# Patient Record
Sex: Male | Born: 1945 | Smoking: Never smoker
Health system: Southern US, Community
[De-identification: ages and names within clinical notes are randomized; demographics above are authoritative.]

## PROBLEM LIST (undated history)

## (undated) DIAGNOSIS — I10 Essential (primary) hypertension: Secondary | ICD-10-CM

## (undated) DIAGNOSIS — E785 Hyperlipidemia, unspecified: Secondary | ICD-10-CM

## (undated) DIAGNOSIS — I499 Cardiac arrhythmia, unspecified: Secondary | ICD-10-CM

## (undated) HISTORY — DX: Cardiac arrhythmia, unspecified: I49.9

## (undated) HISTORY — DX: Essential (primary) hypertension: I10

## (undated) HISTORY — DX: Hyperlipidemia, unspecified: E78.5

---

## 2014-11-23 ENCOUNTER — Other Ambulatory Visit: Payer: Self-pay

## 2014-11-23 DIAGNOSIS — M79676 Pain in unspecified toe(s): Secondary | ICD-10-CM

## 2014-12-14 ENCOUNTER — Encounter: Payer: Self-pay | Admitting: Vascular Surgery

## 2014-12-14 ENCOUNTER — Encounter (HOSPITAL_COMMUNITY): Payer: Self-pay

## 2014-12-20 ENCOUNTER — Encounter: Payer: Self-pay | Admitting: Vascular Surgery

## 2014-12-21 ENCOUNTER — Ambulatory Visit (INDEPENDENT_AMBULATORY_CARE_PROVIDER_SITE_OTHER): Payer: PPO | Admitting: Vascular Surgery

## 2014-12-21 ENCOUNTER — Other Ambulatory Visit: Payer: Self-pay | Admitting: *Deleted

## 2014-12-21 ENCOUNTER — Other Ambulatory Visit: Payer: Self-pay | Admitting: Vascular Surgery

## 2014-12-21 ENCOUNTER — Encounter: Payer: Self-pay | Admitting: Vascular Surgery

## 2014-12-21 ENCOUNTER — Ambulatory Visit (HOSPITAL_COMMUNITY)
Admission: RE | Admit: 2014-12-21 | Discharge: 2014-12-21 | Disposition: A | Discharge status: Home / Self Care | Payer: PPO | Source: Ambulatory Visit | Attending: Vascular Surgery | Admitting: Vascular Surgery

## 2014-12-21 VITALS — BP 175/111 | HR 56 | Resp 18 | Ht 76.0 in | Wt 221.4 lb

## 2014-12-21 DIAGNOSIS — M79676 Pain in unspecified toe(s): Secondary | ICD-10-CM

## 2014-12-21 DIAGNOSIS — I739 Peripheral vascular disease, unspecified: Secondary | ICD-10-CM

## 2014-12-21 LAB — BUN: BUN: 19 mg/dL (ref 6–23)

## 2014-12-21 LAB — CREATININE, SERUM: Creat: 1.1 mg/dL (ref 0.50–1.35)

## 2014-12-21 NOTE — Progress Notes (Signed)
Filed Vitals:   12/21/14 1238 12/21/14 1253  BP: 176/100 175/111  Pulse: 56   Resp: 18   Height: 6\' 4"  (1.93 m)   Weight: 221 lb 6.4 oz (100.426 kg)   Body mass index is 26.96 kg/(m^2).

## 2014-12-21 NOTE — Progress Notes (Signed)
[facility] [place] HISTORY AND PHYSICAL   History of Present Illness:  Patient is a 69 y.o. year old male who presents for evaluation of blistering and dark spots bilateral toes. The patient had an episode in January 2016. He spontaneously developed several dark spots on all of his toes bilaterally. The dark spots then became blisterlike. He was placed on antibiotics and some type of ointment. These spontaneously healed over a few weeks. He states they were painful but the pain has now resolved. He denies claudication. He denies rest pain. He denies prior similar episodes. He denies history of Raynaud's. He denies significant cold exposure during the time the blisters formed.  He is a former smoker but quit 50 years ago. He has no family history of Raynaud's or significant early peripheral arterial disease. He was tested for diabetes and this was negative.  Chronic medical problems include an irregular heartbeat and hypertension as well as hyperlipidemia. These are all currently controlled. He is followed at the [facility] hospital in the room for these. He does take one aspirin daily and has been on this for years.  Past Medical History  Diagnosis Date  . Irregular heartbeat   . Hypertension   . Hyperlipidemia    History reviewed. No pertinent past surgical history.  Social History History  Substance Use Topics  . Smoking status: Not on file  . Smokeless tobacco: Not on file  . Alcohol Use: Not on file    Family History  Denies family history of peripheral arterial disease or abdominal aortic aneurysm Allergies  No Known Allergies    ROS:   General:  No weight loss, Fever, chills  HEENT: No recent headaches, no nasal bleeding, no visual changes, no sore throat  Neurologic: No dizziness, blackouts, seizures. No recent symptoms of stroke or mini- stroke. No recent episodes of slurred speech, or temporary blindness.  Cardiac: No recent episodes of chest  pain/pressure, no shortness of breath at rest.  + shortness of breath with exertion.    Vascular: No history of rest pain in feet.  No history of claudication.  No history of non-healing ulcer, No history of DVT   Pulmonary: No home oxygen, no productive cough, no hemoptysis,  No asthma or wheezing  Musculoskeletal:  [ ]  Arthritis, [ ]  Low back pain,  [ ]  Joint pain  Hematologic:No history of hypercoagulable state.  No history of easy bleeding.  No history of anemia  Gastrointestinal: No hematochezia or melena,  No gastroesophageal reflux, no trouble swallowing  Urinary: [ ]  chronic Kidney disease, [ ]  on HD - [ ]  MWF or [ ]  TTHS, [ ]  Burning with urination, [ ]  Frequent urination, [ ]  Difficulty urinating;   Skin: No rashes  Psychological: No history of anxiety,  No history of depression   Physical Examination  Filed Vitals:   12/21/14 1238  BP: 176/100  Pulse: 56  Resp: 18  Height: 6\' 4"  (1.93 m)  Weight: 221 lb 6.4 oz (100.426 kg)    Body mass index is 26.96 kg/(m^2).  General:  Alert and oriented, no acute distress HEENT: Normal Neck: No bruit or JVD Pulmonary: Clear to auscultation bilaterally Cardiac: Regular Rate and Rhythm without murmur Abdomen: Soft, non-tender, non-distended, no mass, no scars Skin: No rash, dry scaly areas of skin tips of toe 2 right foot and all digits except toe 1 left foot Extremity Pulses:  2+ radial, brachial,1+ femoral, 2+ dorsalis pedis, absent posterior tibial pulses bilaterally Musculoskeletal: No  deformity or edema  Neurologic: Upper and lower extremity motor 5/5 and symmetric  DATA:  The patient had bilateral ABIs performed on March 30.  These were both greater than 1 bilaterally with triphasic waveforms   ASSESSMENT:  History consistent with a central atheroembolic event. Otherwise this may have been some sort of cutaneous infection or variant of Raynaud's less likely   PLAN:  CT Angio abdomen pelvis with bilateral lower  extremity runoff to further evaluate for atheroembolic source.  The patient will return for follow-up after his CT scan.  Ruta Hinds, MD [facility] [place] Office: [phone] Pager: [phone]

## 2015-01-04 ENCOUNTER — Other Ambulatory Visit: Payer: Self-pay

## 2015-01-04 DIAGNOSIS — I714 Abdominal aortic aneurysm, without rupture, unspecified: Secondary | ICD-10-CM

## 2015-01-04 DIAGNOSIS — Z01812 Encounter for preprocedural laboratory examination: Secondary | ICD-10-CM

## 2015-01-05 ENCOUNTER — Ambulatory Visit
Admission: RE | Admit: 2015-01-05 | Discharge: 2015-01-05 | Disposition: A | Discharge status: Home / Self Care | Payer: PPO | Source: Ambulatory Visit | Attending: Vascular Surgery | Admitting: Vascular Surgery

## 2015-01-05 DIAGNOSIS — I739 Peripheral vascular disease, unspecified: Secondary | ICD-10-CM

## 2015-01-05 MED ORDER — IOPAMIDOL (ISOVUE-370) INJECTION 76%
150.0000 mL | Freq: Once | INTRAVENOUS | Status: AC | PRN
  Administered 2015-01-05: 150 mL via INTRAVENOUS

## 2015-01-11 ENCOUNTER — Encounter: Payer: Self-pay | Admitting: Vascular Surgery

## 2015-01-12 ENCOUNTER — Ambulatory Visit (INDEPENDENT_AMBULATORY_CARE_PROVIDER_SITE_OTHER): Payer: PPO | Admitting: Vascular Surgery

## 2015-01-12 ENCOUNTER — Encounter: Payer: Self-pay | Admitting: Vascular Surgery

## 2015-01-12 VITALS — BP 118/78 | HR 86 | Ht 76.0 in | Wt 220.0 lb

## 2015-01-12 DIAGNOSIS — I739 Peripheral vascular disease, unspecified: Secondary | ICD-10-CM

## 2015-01-12 NOTE — Progress Notes (Signed)
[facility] [place] HISTORY AND PHYSICAL    History of Present Illness:  Patient is a 69 y.o. year old male who presents for follow-up evaluation of blistering and dark spots bilateral toes. The patient had an episode in January 2016. He spontaneously developed several dark spots on all of his toes bilaterally. The dark spots then became blisterlike. He was placed on antibiotics and some type of ointment. These spontaneously healed over a few weeks. He states they were painful but the pain has now resolved. He denies claudication. He denies rest pain. He denies prior similar episodes. He denies history of Raynaud's. He denies significant cold exposure during the time the blisters formed.  He is a former smoker but quit 50 years ago. He has no family history of Raynaud's or significant early peripheral arterial disease. He was tested for diabetes and this was negative.  Chronic medical problems include an irregular heartbeat and hypertension as well as hyperlipidemia. These are all currently controlled. He is followed at the [facility] hospital in the room for these. He does take one aspirin daily and has been on this for years.  He has had no further events since he was last seen several weeks ago.    Past Medical History   Diagnosis  Date   .  Irregular heartbeat     .  Hypertension     .  Hyperlipidemia      History reviewed. No pertinent past surgical history.  Social History History   Substance Use Topics   .  Smoking status:  Not on file   .  Smokeless tobacco:  Not on file   .  Alcohol Use:  Not on file     Family History  Denies family history of peripheral arterial disease or abdominal aortic aneurysm Allergies  No Known Allergies    ROS:    General:  No weight loss, Fever, chills  HEENT: No recent headaches, no nasal bleeding, no visual changes, no sore throat  Neurologic: No dizziness, blackouts, seizures. No recent symptoms of stroke or mini- stroke. No  recent episodes of slurred speech, or temporary blindness.  Cardiac: No recent episodes of chest pain/pressure, no shortness of breath at rest.  + shortness of breath with exertion.     Vascular: No history of rest pain in feet.  No history of claudication.  No history of non-healing ulcer, No history of DVT    Pulmonary: No home oxygen, no productive cough, no hemoptysis,  No asthma or wheezing  Musculoskeletal:  [ ]  Arthritis, [ ]  Low back pain,  [ ]  Joint pain  Hematologic:No history of hypercoagulable state.  No history of easy bleeding.  No history of anemia  Gastrointestinal: No hematochezia or melena,  No gastroesophageal reflux, no trouble swallowing  Urinary: [ ]  chronic Kidney disease, [ ]  on HD - [ ]  MWF or [ ]  TTHS, [ ]  Burning with urination, [ ]  Frequent urination, [ ]  Difficulty urinating;    Skin: No rashes  Psychological: No history of anxiety,  No history of depression   Physical Examination    Filed Vitals:   01/12/15 1519  BP: 118/78  Pulse: 86  Height: 6\' 4"  (1.93 m)  Weight: 220 lb (99.791 kg)  SpO2: 85%    General:  Alert and oriented, no acute distress HEENT: Normal Neck: No bruit or JVD Pulmonary: Clear to auscultation bilaterally Cardiac: Regular Rate and Rhythm without murmur Abdomen: Soft, non-tender, non-distended, no mass, no scars  Skin: No rash, dry scaly areas of skin tips of toe 2 right foot and all digits except toe 1 left foot Extremity Pulses:  2+ radial, brachial,1+ femoral, 2+ dorsalis pedis, absent posterior tibial pulses bilaterally Musculoskeletal: No deformity or edema     Neurologic: Upper and lower extremity motor 5/5 and symmetric  DATA:  The patient had bilateral ABIs performed on March 30.  These were both greater than 1 bilaterally with triphasic waveforms.  I reviewed his CT angiogram the abdomen and pelvis today. There is some scattered calcified plaque with no flow limiting lesions from the aorta all the way to the toes  bilaterally. No obvious source of embolic material. The images were reviewed with the patient in the room today. Some calcific plaque scattered appropriate for age with no irregularity or thrombus visualized   ASSESSMENT:  History consistent with a central atheroembolic event. Otherwise this may have been some sort of cutaneous infection or variant of Raynaud's  all lesions have healed at this point.   PLAN:  CT Angio of the chest if he has recurrent symptoms. Otherwise daily aspirin no further follow-up recommended  Ruta Hinds, MD [facility] [place] Office: [phone] Pager: [phone]

## 2016-01-23 DIAGNOSIS — H2513 Age-related nuclear cataract, bilateral: Secondary | ICD-10-CM | POA: Diagnosis not present

## 2016-02-28 DIAGNOSIS — L209 Atopic dermatitis, unspecified: Secondary | ICD-10-CM | POA: Diagnosis not present

## 2016-02-28 DIAGNOSIS — L820 Inflamed seborrheic keratosis: Secondary | ICD-10-CM | POA: Diagnosis not present

## 2016-02-28 DIAGNOSIS — C44319 Basal cell carcinoma of skin of other parts of face: Secondary | ICD-10-CM | POA: Diagnosis not present

## 2016-05-23 IMAGING — CT CT ANGIO AOBIFEM WO/W CM
1 of 10 series · 18 of 38 positions shown · IV contrast ([ID] ISOVUE 370)
Comparison: None.

CLINICAL DATA: Episode 6 months ago, where all toes were red x 2
wks, improved after 2 wks of antibiotics, occasionally large toes
get cold, ex-smoker x 50 yrs, w/o trauma,surgery or hx of ca

EXAM:
CT ANGIOGRAPHY AOBIFEM WITHOUT AND WITH CONTRAST
TECHNIQUE: Volumetric CT image acquisition was acquired from the lung bases
through the lower extremities during and following the rapid
infusion of 150 mL of Isovue 370 intravenous contrast.
Computer-generated MIP and MPR reconstructed images were also
acquired in the coronal and sagittal planes.
CONTRAST:  150 mL of Isovue 370 intravenous contrast

[Series 5: runoff · axial · 0.74mm/px · z∈[-1482,-60]mm · 18 of 565 slices shown]
[im 22/565  lung]
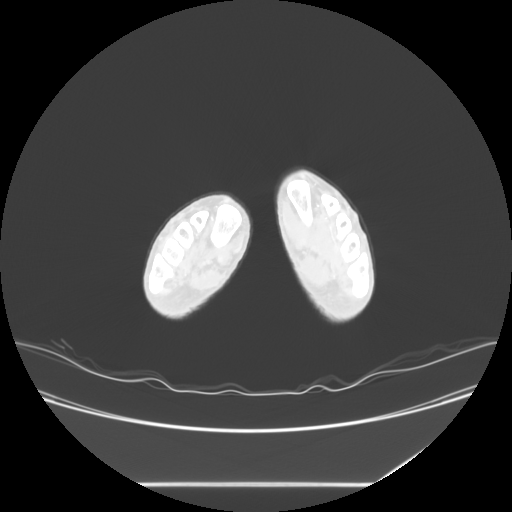
[im 66/565  mediastinal]
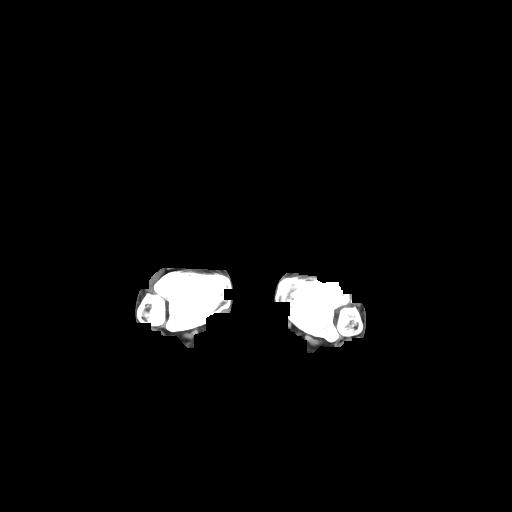
[im 87/565  lung]
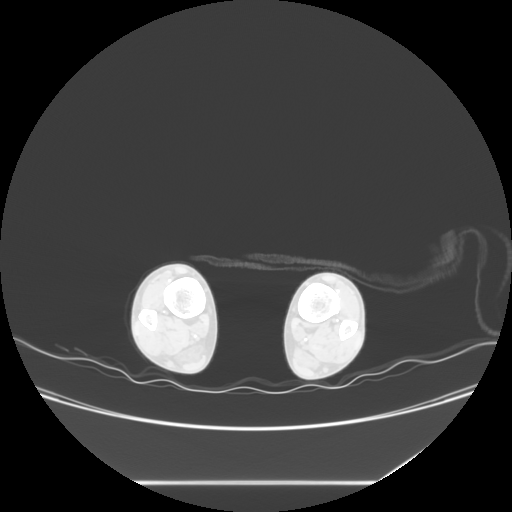
[im 109/565  mediastinal]
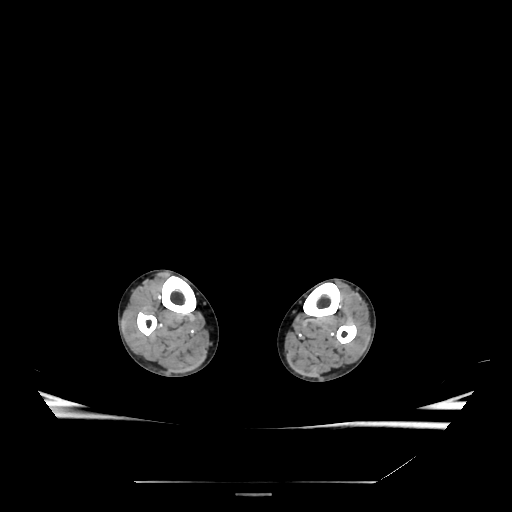
[im 152/565  lung]
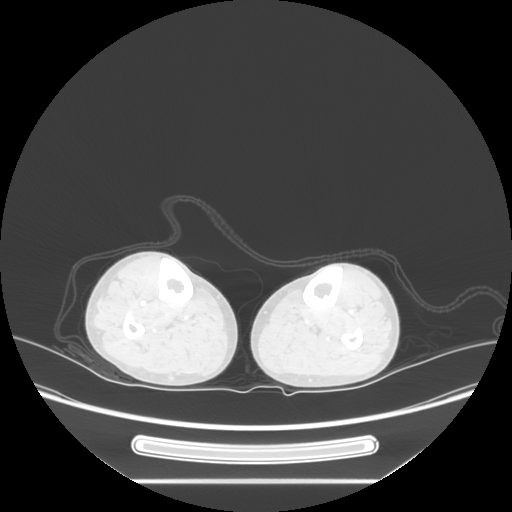
[im 174/565  mediastinal]
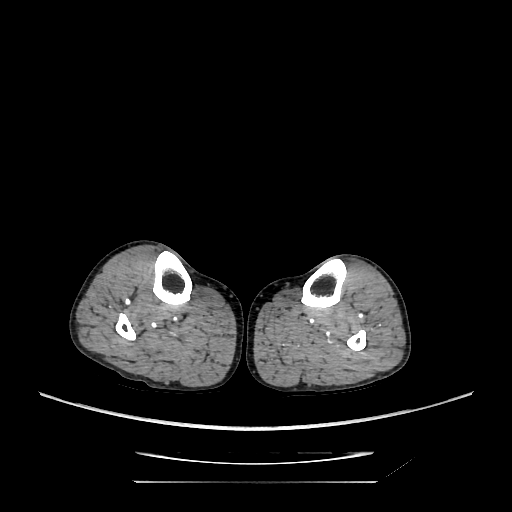
[im 217/565  lung]
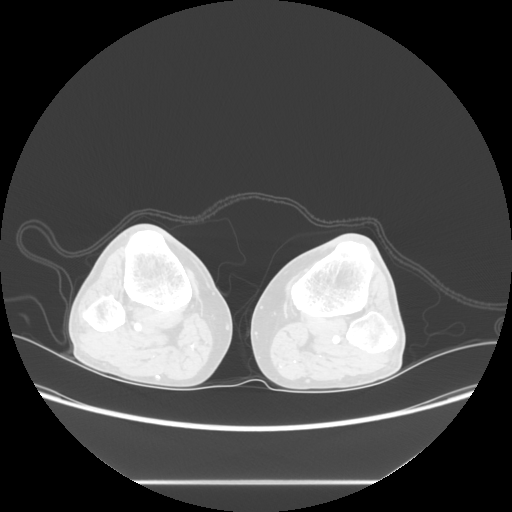
[im 239/565  mediastinal]
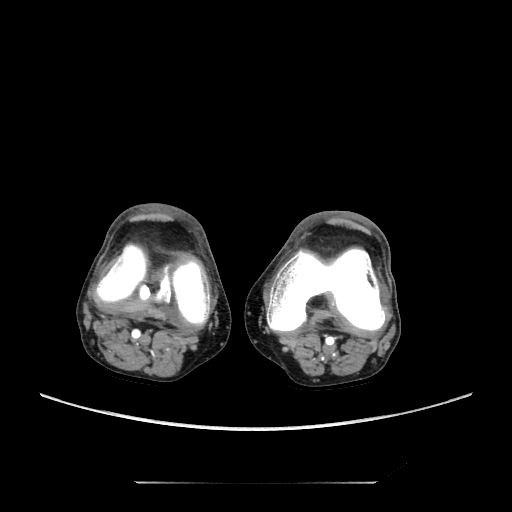
[im 261/565  lung]
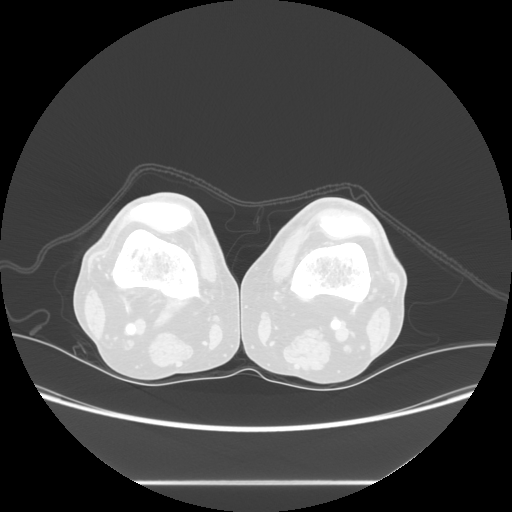
[im 304/565  mediastinal]
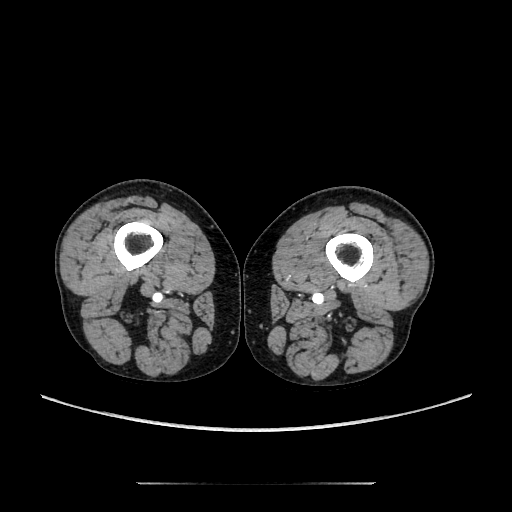
[im 326/565  lung]
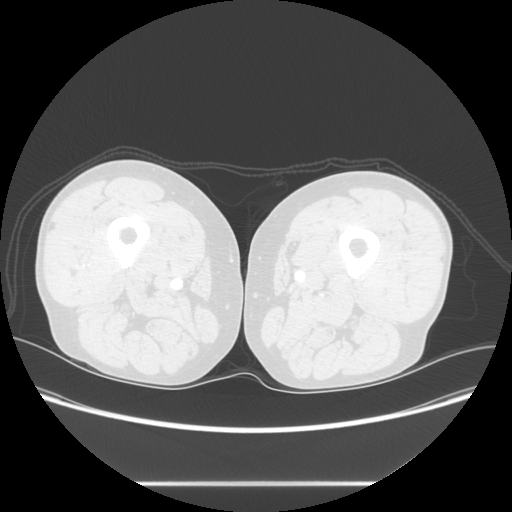
[im 348/565  mediastinal]
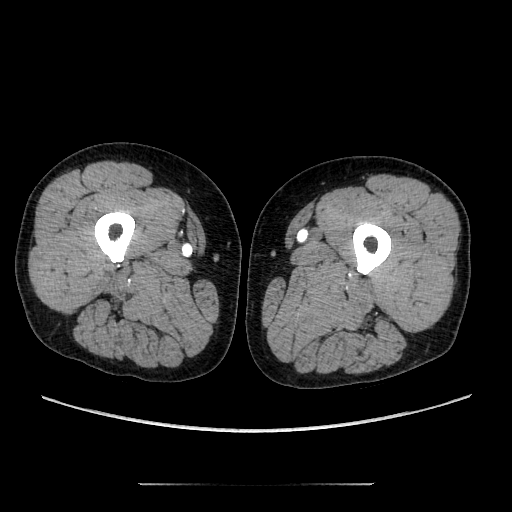
[im 391/565  lung]
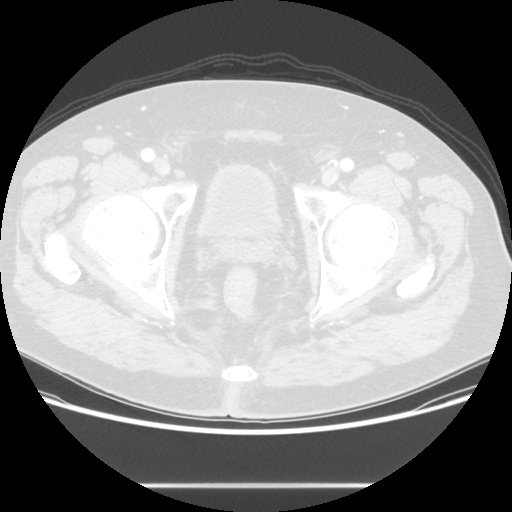
[im 413/565  mediastinal]
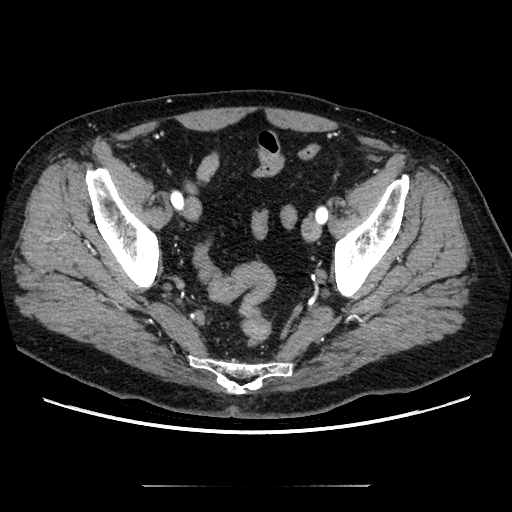
[im 456/565  lung]
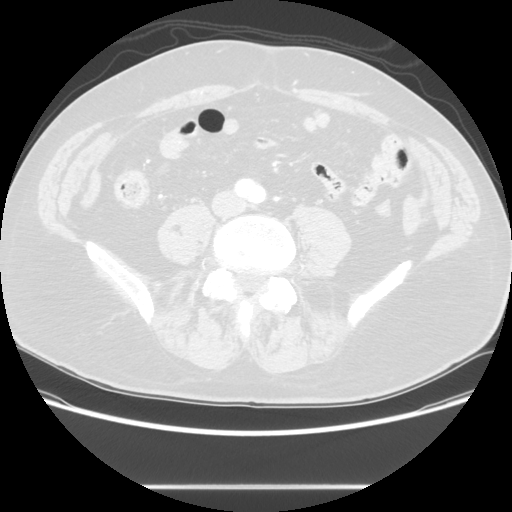
[im 478/565  mediastinal]
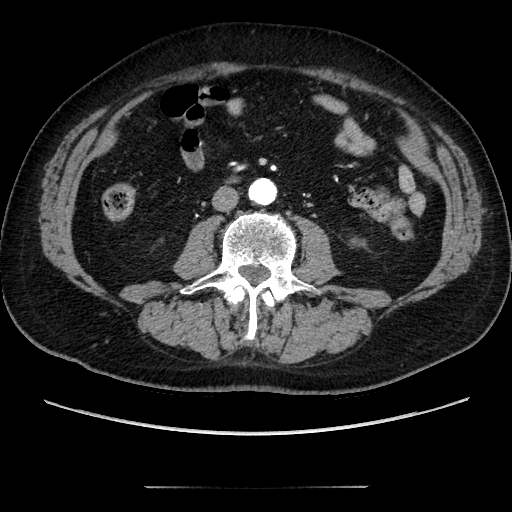
[im 499/565  lung]
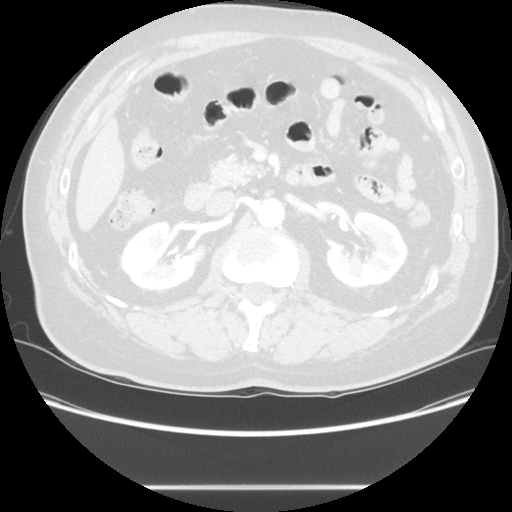
[im 543/565  mediastinal]
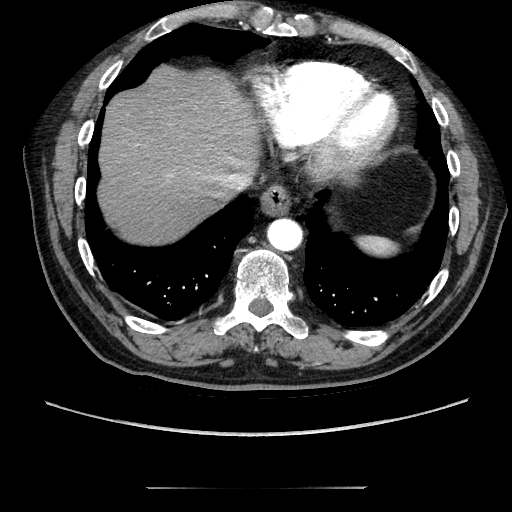

[18 of 38 positions shown; findings below may reference images not displayed]

FINDINGS: ANGIOGRAPHIC STUDY:

Abdominal aorta is mildly ectatic, measuring a maximum of 2.2 cm
from anterior to posterior. There is no focal aneurysm. Mild partly
calcified plaque is noted along the course of the abdominal aorta.
No significant stenosis. The common iliac arteries are widely patent
as are the external iliac arteries and the left internal iliac
artery. Focal plaque severely narrows a branch of the right internal
iliac artery.

Partly calcified plaque is noted at the origins of the celiac axis
and superior mesenteric arteries. No significant narrowing of the
superior mesenteric artery. Celiac axis there are by approximate 50%
at its origin. Mild plaque noted at the origins of the single
bilateral renal arteries without significant stenosis. Inferior
mesenteric artery is patent.

Minor plaque is noted along the common femoral arteries and
superficial femoral arteries bilaterally. There is also minor plaque
in the popliteal arteries bilaterally. No significant stenosis of
these vessels.

Irregular partly calcified plaque is noted along the calf vessels
bilaterally. There is limited arterial inflow from the posterior
tubular arteries into each foot. Most of the flow to the this from
the dorsalis pedis arteries, which also appears to be decreased, but
relatively symmetric.

Review of the MIP images confirms the above findings.

ABDOMEN PELVIS CT:

Minor subsegmental atelectasis at the posterior lung bases. Heart is
normal in size. Liver, spleen, gallbladder, pancreas, adrenal
glands: Unremarkable. 13 mm exophytic left renal upper pole cyst.
Kidneys otherwise unremarkable. Normal ureters. Bladder is
unremarkable. No adenopathy. No ascites. Bowel is unremarkable.

LOWER EXTREMITIES:

No masses.  No abnormal fluid collections.  No knee joint effusions.
IMPRESSION: 1. No significant focal stenosis of the abdominal aorta or large
arteries of either lower extremity.
2. Heterogeneous atherosclerosis of the calf vessels with relatively
decreased inflow into the feet, without a discrete focal stenosis.
3. No acute findings within the abdomen or pelvis.

## 2016-08-29 DIAGNOSIS — L570 Actinic keratosis: Secondary | ICD-10-CM | POA: Diagnosis not present

## 2016-10-22 DIAGNOSIS — Z7189 Other specified counseling: Secondary | ICD-10-CM | POA: Diagnosis not present

## 2016-10-22 DIAGNOSIS — M19029 Primary osteoarthritis, unspecified elbow: Secondary | ICD-10-CM | POA: Diagnosis not present

## 2016-10-22 DIAGNOSIS — E7800 Pure hypercholesterolemia, unspecified: Secondary | ICD-10-CM | POA: Diagnosis not present

## 2016-10-22 DIAGNOSIS — I10 Essential (primary) hypertension: Secondary | ICD-10-CM | POA: Diagnosis not present

## 2016-10-22 DIAGNOSIS — Z79899 Other long term (current) drug therapy: Secondary | ICD-10-CM | POA: Diagnosis not present

## 2016-11-09 DIAGNOSIS — H66002 Acute suppurative otitis media without spontaneous rupture of ear drum, left ear: Secondary | ICD-10-CM | POA: Diagnosis not present

## 2016-11-09 DIAGNOSIS — H6990 Unspecified Eustachian tube disorder, unspecified ear: Secondary | ICD-10-CM | POA: Diagnosis not present

## 2017-10-17 DIAGNOSIS — I10 Essential (primary) hypertension: Secondary | ICD-10-CM | POA: Diagnosis not present

## 2017-10-17 DIAGNOSIS — Z125 Encounter for screening for malignant neoplasm of prostate: Secondary | ICD-10-CM | POA: Diagnosis not present

## 2017-10-17 DIAGNOSIS — E7800 Pure hypercholesterolemia, unspecified: Secondary | ICD-10-CM | POA: Diagnosis not present

## 2017-10-23 DIAGNOSIS — K219 Gastro-esophageal reflux disease without esophagitis: Secondary | ICD-10-CM | POA: Diagnosis not present

## 2017-10-23 DIAGNOSIS — I10 Essential (primary) hypertension: Secondary | ICD-10-CM | POA: Diagnosis not present

## 2017-10-23 DIAGNOSIS — M25511 Pain in right shoulder: Secondary | ICD-10-CM | POA: Diagnosis not present

## 2017-10-23 DIAGNOSIS — M25512 Pain in left shoulder: Secondary | ICD-10-CM | POA: Diagnosis not present

## 2017-10-23 DIAGNOSIS — J029 Acute pharyngitis, unspecified: Secondary | ICD-10-CM | POA: Diagnosis not present

## 2017-10-23 DIAGNOSIS — J3089 Other allergic rhinitis: Secondary | ICD-10-CM | POA: Diagnosis not present

## 2017-10-23 DIAGNOSIS — E7800 Pure hypercholesterolemia, unspecified: Secondary | ICD-10-CM | POA: Diagnosis not present

## 2017-10-23 DIAGNOSIS — G8929 Other chronic pain: Secondary | ICD-10-CM | POA: Diagnosis not present

## 2017-10-23 DIAGNOSIS — Z86010 Personal history of colonic polyps: Secondary | ICD-10-CM | POA: Diagnosis not present

## 2017-10-23 DIAGNOSIS — M19029 Primary osteoarthritis, unspecified elbow: Secondary | ICD-10-CM | POA: Diagnosis not present

## 2017-11-11 DIAGNOSIS — K219 Gastro-esophageal reflux disease without esophagitis: Secondary | ICD-10-CM | POA: Diagnosis not present

## 2017-11-11 DIAGNOSIS — R1310 Dysphagia, unspecified: Secondary | ICD-10-CM | POA: Diagnosis not present

## 2017-11-11 DIAGNOSIS — Z86010 Personal history of colonic polyps: Secondary | ICD-10-CM | POA: Diagnosis not present

## 2017-11-27 DIAGNOSIS — K449 Diaphragmatic hernia without obstruction or gangrene: Secondary | ICD-10-CM | POA: Diagnosis not present

## 2017-11-27 DIAGNOSIS — K5289 Other specified noninfective gastroenteritis and colitis: Secondary | ICD-10-CM | POA: Diagnosis not present

## 2017-11-27 DIAGNOSIS — K228 Other specified diseases of esophagus: Secondary | ICD-10-CM | POA: Diagnosis not present

## 2017-11-27 DIAGNOSIS — K6389 Other specified diseases of intestine: Secondary | ICD-10-CM | POA: Diagnosis not present

## 2017-11-27 DIAGNOSIS — Q438 Other specified congenital malformations of intestine: Secondary | ICD-10-CM | POA: Diagnosis not present

## 2017-11-27 DIAGNOSIS — K2930 Chronic superficial gastritis without bleeding: Secondary | ICD-10-CM | POA: Diagnosis not present

## 2017-11-27 DIAGNOSIS — R1310 Dysphagia, unspecified: Secondary | ICD-10-CM | POA: Diagnosis not present

## 2017-11-27 DIAGNOSIS — Z86010 Personal history of colonic polyps: Secondary | ICD-10-CM | POA: Diagnosis not present

## 2017-12-03 DIAGNOSIS — K2930 Chronic superficial gastritis without bleeding: Secondary | ICD-10-CM | POA: Diagnosis not present

## 2017-12-03 DIAGNOSIS — K228 Other specified diseases of esophagus: Secondary | ICD-10-CM | POA: Diagnosis not present

## 2018-03-20 DIAGNOSIS — H2513 Age-related nuclear cataract, bilateral: Secondary | ICD-10-CM | POA: Diagnosis not present

## 2018-03-20 DIAGNOSIS — H02839 Dermatochalasis of unspecified eye, unspecified eyelid: Secondary | ICD-10-CM | POA: Diagnosis not present

## 2018-11-23 DIAGNOSIS — E7800 Pure hypercholesterolemia, unspecified: Secondary | ICD-10-CM | POA: Diagnosis not present

## 2018-11-23 DIAGNOSIS — I10 Essential (primary) hypertension: Secondary | ICD-10-CM | POA: Diagnosis not present

## 2018-11-23 DIAGNOSIS — Z125 Encounter for screening for malignant neoplasm of prostate: Secondary | ICD-10-CM | POA: Diagnosis not present

## 2018-11-23 DIAGNOSIS — R7303 Prediabetes: Secondary | ICD-10-CM | POA: Diagnosis not present

## 2018-11-23 DIAGNOSIS — Z79899 Other long term (current) drug therapy: Secondary | ICD-10-CM | POA: Diagnosis not present

## 2018-11-26 DIAGNOSIS — Z125 Encounter for screening for malignant neoplasm of prostate: Secondary | ICD-10-CM | POA: Diagnosis not present

## 2018-11-26 DIAGNOSIS — M19029 Primary osteoarthritis, unspecified elbow: Secondary | ICD-10-CM | POA: Diagnosis not present

## 2018-11-26 DIAGNOSIS — Z7189 Other specified counseling: Secondary | ICD-10-CM | POA: Diagnosis not present

## 2018-11-26 DIAGNOSIS — Z1389 Encounter for screening for other disorder: Secondary | ICD-10-CM | POA: Diagnosis not present

## 2018-11-26 DIAGNOSIS — Z79899 Other long term (current) drug therapy: Secondary | ICD-10-CM | POA: Diagnosis not present

## 2018-11-26 DIAGNOSIS — R7303 Prediabetes: Secondary | ICD-10-CM | POA: Diagnosis not present

## 2018-11-26 DIAGNOSIS — J3089 Other allergic rhinitis: Secondary | ICD-10-CM | POA: Diagnosis not present

## 2018-11-26 DIAGNOSIS — K219 Gastro-esophageal reflux disease without esophagitis: Secondary | ICD-10-CM | POA: Diagnosis not present

## 2018-11-26 DIAGNOSIS — E7800 Pure hypercholesterolemia, unspecified: Secondary | ICD-10-CM | POA: Diagnosis not present

## 2018-11-26 DIAGNOSIS — I10 Essential (primary) hypertension: Secondary | ICD-10-CM | POA: Diagnosis not present

## 2018-11-26 DIAGNOSIS — Z86010 Personal history of colonic polyps: Secondary | ICD-10-CM | POA: Diagnosis not present

## 2019-02-10 DIAGNOSIS — I10 Essential (primary) hypertension: Secondary | ICD-10-CM | POA: Diagnosis not present

## 2019-02-10 DIAGNOSIS — M19029 Primary osteoarthritis, unspecified elbow: Secondary | ICD-10-CM | POA: Diagnosis not present

## 2019-04-06 DIAGNOSIS — M19029 Primary osteoarthritis, unspecified elbow: Secondary | ICD-10-CM | POA: Diagnosis not present

## 2019-04-06 DIAGNOSIS — I10 Essential (primary) hypertension: Secondary | ICD-10-CM | POA: Diagnosis not present

## 2019-04-06 DIAGNOSIS — E7800 Pure hypercholesterolemia, unspecified: Secondary | ICD-10-CM | POA: Diagnosis not present

## 2019-06-10 DIAGNOSIS — Z23 Encounter for immunization: Secondary | ICD-10-CM | POA: Diagnosis not present

## 2019-07-20 DIAGNOSIS — M19029 Primary osteoarthritis, unspecified elbow: Secondary | ICD-10-CM | POA: Diagnosis not present

## 2019-07-20 DIAGNOSIS — E7800 Pure hypercholesterolemia, unspecified: Secondary | ICD-10-CM | POA: Diagnosis not present

## 2019-07-20 DIAGNOSIS — I10 Essential (primary) hypertension: Secondary | ICD-10-CM | POA: Diagnosis not present

## 2019-10-19 DIAGNOSIS — I10 Essential (primary) hypertension: Secondary | ICD-10-CM | POA: Diagnosis not present

## 2019-10-19 DIAGNOSIS — E7800 Pure hypercholesterolemia, unspecified: Secondary | ICD-10-CM | POA: Diagnosis not present

## 2019-10-19 DIAGNOSIS — M19029 Primary osteoarthritis, unspecified elbow: Secondary | ICD-10-CM | POA: Diagnosis not present

## 2019-11-24 DIAGNOSIS — Z86010 Personal history of colonic polyps: Secondary | ICD-10-CM | POA: Diagnosis not present

## 2019-11-24 DIAGNOSIS — K219 Gastro-esophageal reflux disease without esophagitis: Secondary | ICD-10-CM | POA: Diagnosis not present

## 2019-11-24 DIAGNOSIS — R7309 Other abnormal glucose: Secondary | ICD-10-CM | POA: Diagnosis not present

## 2019-11-24 DIAGNOSIS — E7800 Pure hypercholesterolemia, unspecified: Secondary | ICD-10-CM | POA: Diagnosis not present

## 2019-11-24 DIAGNOSIS — Z7189 Other specified counseling: Secondary | ICD-10-CM | POA: Diagnosis not present

## 2019-11-24 DIAGNOSIS — Z79899 Other long term (current) drug therapy: Secondary | ICD-10-CM | POA: Diagnosis not present

## 2019-11-24 DIAGNOSIS — Z125 Encounter for screening for malignant neoplasm of prostate: Secondary | ICD-10-CM | POA: Diagnosis not present

## 2019-11-24 DIAGNOSIS — Z1389 Encounter for screening for other disorder: Secondary | ICD-10-CM | POA: Diagnosis not present

## 2019-11-24 DIAGNOSIS — M19029 Primary osteoarthritis, unspecified elbow: Secondary | ICD-10-CM | POA: Diagnosis not present

## 2019-11-24 DIAGNOSIS — I10 Essential (primary) hypertension: Secondary | ICD-10-CM | POA: Diagnosis not present

## 2019-11-24 DIAGNOSIS — J3089 Other allergic rhinitis: Secondary | ICD-10-CM | POA: Diagnosis not present

## 2019-11-29 DIAGNOSIS — E7800 Pure hypercholesterolemia, unspecified: Secondary | ICD-10-CM | POA: Diagnosis not present

## 2019-11-29 DIAGNOSIS — Z79899 Other long term (current) drug therapy: Secondary | ICD-10-CM | POA: Diagnosis not present

## 2019-11-29 DIAGNOSIS — J3089 Other allergic rhinitis: Secondary | ICD-10-CM | POA: Diagnosis not present

## 2019-11-29 DIAGNOSIS — K219 Gastro-esophageal reflux disease without esophagitis: Secondary | ICD-10-CM | POA: Diagnosis not present

## 2019-11-29 DIAGNOSIS — I10 Essential (primary) hypertension: Secondary | ICD-10-CM | POA: Diagnosis not present

## 2019-11-29 DIAGNOSIS — R7309 Other abnormal glucose: Secondary | ICD-10-CM | POA: Diagnosis not present

## 2020-03-22 DIAGNOSIS — I10 Essential (primary) hypertension: Secondary | ICD-10-CM | POA: Diagnosis not present

## 2020-03-22 DIAGNOSIS — M19029 Primary osteoarthritis, unspecified elbow: Secondary | ICD-10-CM | POA: Diagnosis not present

## 2020-03-22 DIAGNOSIS — E7800 Pure hypercholesterolemia, unspecified: Secondary | ICD-10-CM | POA: Diagnosis not present

## 2020-05-13 DIAGNOSIS — M19029 Primary osteoarthritis, unspecified elbow: Secondary | ICD-10-CM | POA: Diagnosis not present

## 2020-05-13 DIAGNOSIS — E7800 Pure hypercholesterolemia, unspecified: Secondary | ICD-10-CM | POA: Diagnosis not present

## 2020-05-13 DIAGNOSIS — I10 Essential (primary) hypertension: Secondary | ICD-10-CM | POA: Diagnosis not present

## 2020-06-15 DIAGNOSIS — R7309 Other abnormal glucose: Secondary | ICD-10-CM | POA: Diagnosis not present

## 2020-06-15 DIAGNOSIS — I10 Essential (primary) hypertension: Secondary | ICD-10-CM | POA: Diagnosis not present

## 2020-06-15 DIAGNOSIS — K219 Gastro-esophageal reflux disease without esophagitis: Secondary | ICD-10-CM | POA: Diagnosis not present

## 2020-06-15 DIAGNOSIS — J3089 Other allergic rhinitis: Secondary | ICD-10-CM | POA: Diagnosis not present

## 2020-06-15 DIAGNOSIS — Z79899 Other long term (current) drug therapy: Secondary | ICD-10-CM | POA: Diagnosis not present

## 2020-06-15 DIAGNOSIS — E7800 Pure hypercholesterolemia, unspecified: Secondary | ICD-10-CM | POA: Diagnosis not present

## 2020-06-20 DIAGNOSIS — Z23 Encounter for immunization: Secondary | ICD-10-CM | POA: Diagnosis not present

## 2020-07-03 DIAGNOSIS — M19029 Primary osteoarthritis, unspecified elbow: Secondary | ICD-10-CM | POA: Diagnosis not present

## 2020-07-03 DIAGNOSIS — E7800 Pure hypercholesterolemia, unspecified: Secondary | ICD-10-CM | POA: Diagnosis not present

## 2020-07-03 DIAGNOSIS — I10 Essential (primary) hypertension: Secondary | ICD-10-CM | POA: Diagnosis not present

## 2020-10-23 DIAGNOSIS — M19029 Primary osteoarthritis, unspecified elbow: Secondary | ICD-10-CM | POA: Diagnosis not present

## 2020-10-23 DIAGNOSIS — E7800 Pure hypercholesterolemia, unspecified: Secondary | ICD-10-CM | POA: Diagnosis not present

## 2020-10-23 DIAGNOSIS — K219 Gastro-esophageal reflux disease without esophagitis: Secondary | ICD-10-CM | POA: Diagnosis not present

## 2020-10-23 DIAGNOSIS — G8929 Other chronic pain: Secondary | ICD-10-CM | POA: Diagnosis not present

## 2020-10-23 DIAGNOSIS — I10 Essential (primary) hypertension: Secondary | ICD-10-CM | POA: Diagnosis not present

## 2020-11-29 DIAGNOSIS — I10 Essential (primary) hypertension: Secondary | ICD-10-CM | POA: Diagnosis not present

## 2020-11-29 DIAGNOSIS — K219 Gastro-esophageal reflux disease without esophagitis: Secondary | ICD-10-CM | POA: Diagnosis not present

## 2020-11-29 DIAGNOSIS — M19029 Primary osteoarthritis, unspecified elbow: Secondary | ICD-10-CM | POA: Diagnosis not present

## 2020-11-29 DIAGNOSIS — G8929 Other chronic pain: Secondary | ICD-10-CM | POA: Diagnosis not present

## 2020-11-29 DIAGNOSIS — E7800 Pure hypercholesterolemia, unspecified: Secondary | ICD-10-CM | POA: Diagnosis not present

## 2021-01-09 DIAGNOSIS — E7800 Pure hypercholesterolemia, unspecified: Secondary | ICD-10-CM | POA: Diagnosis not present

## 2021-01-09 DIAGNOSIS — K219 Gastro-esophageal reflux disease without esophagitis: Secondary | ICD-10-CM | POA: Diagnosis not present

## 2021-01-09 DIAGNOSIS — R7309 Other abnormal glucose: Secondary | ICD-10-CM | POA: Diagnosis not present

## 2021-01-09 DIAGNOSIS — E559 Vitamin D deficiency, unspecified: Secondary | ICD-10-CM | POA: Diagnosis not present

## 2021-01-09 DIAGNOSIS — Z79899 Other long term (current) drug therapy: Secondary | ICD-10-CM | POA: Diagnosis not present

## 2021-01-09 DIAGNOSIS — I10 Essential (primary) hypertension: Secondary | ICD-10-CM | POA: Diagnosis not present

## 2021-01-09 DIAGNOSIS — J3089 Other allergic rhinitis: Secondary | ICD-10-CM | POA: Diagnosis not present

## 2021-01-12 DIAGNOSIS — Z79899 Other long term (current) drug therapy: Secondary | ICD-10-CM | POA: Diagnosis not present

## 2021-01-12 DIAGNOSIS — J3089 Other allergic rhinitis: Secondary | ICD-10-CM | POA: Diagnosis not present

## 2021-01-12 DIAGNOSIS — R7303 Prediabetes: Secondary | ICD-10-CM | POA: Diagnosis not present

## 2021-01-12 DIAGNOSIS — I10 Essential (primary) hypertension: Secondary | ICD-10-CM | POA: Diagnosis not present

## 2021-01-12 DIAGNOSIS — K219 Gastro-esophageal reflux disease without esophagitis: Secondary | ICD-10-CM | POA: Diagnosis not present

## 2021-01-12 DIAGNOSIS — E7800 Pure hypercholesterolemia, unspecified: Secondary | ICD-10-CM | POA: Diagnosis not present

## 2021-02-12 DIAGNOSIS — G8929 Other chronic pain: Secondary | ICD-10-CM | POA: Diagnosis not present

## 2021-02-12 DIAGNOSIS — E7800 Pure hypercholesterolemia, unspecified: Secondary | ICD-10-CM | POA: Diagnosis not present

## 2021-02-12 DIAGNOSIS — I10 Essential (primary) hypertension: Secondary | ICD-10-CM | POA: Diagnosis not present

## 2021-02-12 DIAGNOSIS — M19029 Primary osteoarthritis, unspecified elbow: Secondary | ICD-10-CM | POA: Diagnosis not present

## 2021-02-12 DIAGNOSIS — K219 Gastro-esophageal reflux disease without esophagitis: Secondary | ICD-10-CM | POA: Diagnosis not present

## 2021-02-27 DIAGNOSIS — R059 Cough, unspecified: Secondary | ICD-10-CM | POA: Diagnosis not present

## 2021-02-27 DIAGNOSIS — R0981 Nasal congestion: Secondary | ICD-10-CM | POA: Diagnosis not present

## 2021-02-27 DIAGNOSIS — Z03818 Encounter for observation for suspected exposure to other biological agents ruled out: Secondary | ICD-10-CM | POA: Diagnosis not present

## 2021-04-27 DIAGNOSIS — I10 Essential (primary) hypertension: Secondary | ICD-10-CM | POA: Diagnosis not present

## 2021-04-27 DIAGNOSIS — E7800 Pure hypercholesterolemia, unspecified: Secondary | ICD-10-CM | POA: Diagnosis not present

## 2021-04-27 DIAGNOSIS — M19029 Primary osteoarthritis, unspecified elbow: Secondary | ICD-10-CM | POA: Diagnosis not present

## 2021-04-27 DIAGNOSIS — G8929 Other chronic pain: Secondary | ICD-10-CM | POA: Diagnosis not present

## 2021-04-27 DIAGNOSIS — K219 Gastro-esophageal reflux disease without esophagitis: Secondary | ICD-10-CM | POA: Diagnosis not present

## 2021-06-22 DIAGNOSIS — E785 Hyperlipidemia, unspecified: Secondary | ICD-10-CM | POA: Diagnosis not present

## 2021-06-22 DIAGNOSIS — M19029 Primary osteoarthritis, unspecified elbow: Secondary | ICD-10-CM | POA: Diagnosis not present

## 2021-06-22 DIAGNOSIS — G8929 Other chronic pain: Secondary | ICD-10-CM | POA: Diagnosis not present

## 2021-06-22 DIAGNOSIS — K219 Gastro-esophageal reflux disease without esophagitis: Secondary | ICD-10-CM | POA: Diagnosis not present

## 2021-06-22 DIAGNOSIS — E7800 Pure hypercholesterolemia, unspecified: Secondary | ICD-10-CM | POA: Diagnosis not present

## 2021-06-22 DIAGNOSIS — I10 Essential (primary) hypertension: Secondary | ICD-10-CM | POA: Diagnosis not present

## 2021-06-25 DIAGNOSIS — K219 Gastro-esophageal reflux disease without esophagitis: Secondary | ICD-10-CM | POA: Diagnosis not present

## 2021-06-25 DIAGNOSIS — I10 Essential (primary) hypertension: Secondary | ICD-10-CM | POA: Diagnosis not present

## 2021-06-25 DIAGNOSIS — E7800 Pure hypercholesterolemia, unspecified: Secondary | ICD-10-CM | POA: Diagnosis not present

## 2021-06-25 DIAGNOSIS — G8929 Other chronic pain: Secondary | ICD-10-CM | POA: Diagnosis not present

## 2021-06-25 DIAGNOSIS — M19029 Primary osteoarthritis, unspecified elbow: Secondary | ICD-10-CM | POA: Diagnosis not present

## 2021-06-25 DIAGNOSIS — E785 Hyperlipidemia, unspecified: Secondary | ICD-10-CM | POA: Diagnosis not present

## 2021-07-13 DIAGNOSIS — Z0000 Encounter for general adult medical examination without abnormal findings: Secondary | ICD-10-CM | POA: Diagnosis not present

## 2021-07-13 DIAGNOSIS — R7309 Other abnormal glucose: Secondary | ICD-10-CM | POA: Diagnosis not present

## 2021-07-13 DIAGNOSIS — R7303 Prediabetes: Secondary | ICD-10-CM | POA: Diagnosis not present

## 2021-07-20 DIAGNOSIS — R1310 Dysphagia, unspecified: Secondary | ICD-10-CM | POA: Diagnosis not present

## 2021-07-20 DIAGNOSIS — I10 Essential (primary) hypertension: Secondary | ICD-10-CM | POA: Diagnosis not present

## 2021-07-20 DIAGNOSIS — E785 Hyperlipidemia, unspecified: Secondary | ICD-10-CM | POA: Diagnosis not present

## 2021-07-20 DIAGNOSIS — J3089 Other allergic rhinitis: Secondary | ICD-10-CM | POA: Diagnosis not present

## 2021-07-20 DIAGNOSIS — R7303 Prediabetes: Secondary | ICD-10-CM | POA: Diagnosis not present

## 2021-09-11 DIAGNOSIS — E785 Hyperlipidemia, unspecified: Secondary | ICD-10-CM | POA: Diagnosis not present

## 2022-01-16 DIAGNOSIS — R1310 Dysphagia, unspecified: Secondary | ICD-10-CM | POA: Diagnosis not present

## 2022-01-16 DIAGNOSIS — Z79899 Other long term (current) drug therapy: Secondary | ICD-10-CM | POA: Diagnosis not present

## 2022-01-16 DIAGNOSIS — E7800 Pure hypercholesterolemia, unspecified: Secondary | ICD-10-CM | POA: Diagnosis not present

## 2022-01-16 DIAGNOSIS — Z23 Encounter for immunization: Secondary | ICD-10-CM | POA: Diagnosis not present

## 2022-01-16 DIAGNOSIS — R7309 Other abnormal glucose: Secondary | ICD-10-CM | POA: Diagnosis not present

## 2022-01-16 DIAGNOSIS — E785 Hyperlipidemia, unspecified: Secondary | ICD-10-CM | POA: Diagnosis not present

## 2022-01-16 DIAGNOSIS — I10 Essential (primary) hypertension: Secondary | ICD-10-CM | POA: Diagnosis not present

## 2022-01-16 DIAGNOSIS — J3089 Other allergic rhinitis: Secondary | ICD-10-CM | POA: Diagnosis not present

## 2022-02-25 DIAGNOSIS — H25813 Combined forms of age-related cataract, bilateral: Secondary | ICD-10-CM | POA: Diagnosis not present

## 2022-02-25 DIAGNOSIS — H524 Presbyopia: Secondary | ICD-10-CM | POA: Diagnosis not present

## 2022-03-06 DIAGNOSIS — H35372 Puckering of macula, left eye: Secondary | ICD-10-CM | POA: Diagnosis not present

## 2022-03-06 DIAGNOSIS — H25813 Combined forms of age-related cataract, bilateral: Secondary | ICD-10-CM | POA: Diagnosis not present

## 2022-03-19 DIAGNOSIS — H2511 Age-related nuclear cataract, right eye: Secondary | ICD-10-CM | POA: Diagnosis not present

## 2022-04-02 DIAGNOSIS — H2512 Age-related nuclear cataract, left eye: Secondary | ICD-10-CM | POA: Diagnosis not present

## 2022-04-02 DIAGNOSIS — K219 Gastro-esophageal reflux disease without esophagitis: Secondary | ICD-10-CM | POA: Diagnosis not present

## 2022-07-17 DIAGNOSIS — R7309 Other abnormal glucose: Secondary | ICD-10-CM | POA: Diagnosis not present

## 2022-07-17 DIAGNOSIS — Z23 Encounter for immunization: Secondary | ICD-10-CM | POA: Diagnosis not present

## 2022-07-17 DIAGNOSIS — Z1331 Encounter for screening for depression: Secondary | ICD-10-CM | POA: Diagnosis not present

## 2022-07-17 DIAGNOSIS — Z0000 Encounter for general adult medical examination without abnormal findings: Secondary | ICD-10-CM | POA: Diagnosis not present

## 2022-07-17 DIAGNOSIS — Z6823 Body mass index (BMI) 23.0-23.9, adult: Secondary | ICD-10-CM | POA: Diagnosis not present

## 2022-07-17 DIAGNOSIS — R7303 Prediabetes: Secondary | ICD-10-CM | POA: Diagnosis not present

## 2022-07-23 DIAGNOSIS — I10 Essential (primary) hypertension: Secondary | ICD-10-CM | POA: Diagnosis not present

## 2022-07-23 DIAGNOSIS — E7800 Pure hypercholesterolemia, unspecified: Secondary | ICD-10-CM | POA: Diagnosis not present

## 2022-07-23 DIAGNOSIS — Z6823 Body mass index (BMI) 23.0-23.9, adult: Secondary | ICD-10-CM | POA: Diagnosis not present

## 2022-07-23 DIAGNOSIS — K219 Gastro-esophageal reflux disease without esophagitis: Secondary | ICD-10-CM | POA: Diagnosis not present

## 2022-07-23 DIAGNOSIS — Z79899 Other long term (current) drug therapy: Secondary | ICD-10-CM | POA: Diagnosis not present

## 2022-07-23 DIAGNOSIS — Z23 Encounter for immunization: Secondary | ICD-10-CM | POA: Diagnosis not present

## 2022-07-23 DIAGNOSIS — R7309 Other abnormal glucose: Secondary | ICD-10-CM | POA: Diagnosis not present

## 2023-01-30 DIAGNOSIS — Z79899 Other long term (current) drug therapy: Secondary | ICD-10-CM | POA: Diagnosis not present

## 2023-01-30 DIAGNOSIS — R7303 Prediabetes: Secondary | ICD-10-CM | POA: Diagnosis not present

## 2023-01-30 DIAGNOSIS — I10 Essential (primary) hypertension: Secondary | ICD-10-CM | POA: Diagnosis not present

## 2023-01-30 DIAGNOSIS — E7800 Pure hypercholesterolemia, unspecified: Secondary | ICD-10-CM | POA: Diagnosis not present

## 2023-02-03 DIAGNOSIS — R7309 Other abnormal glucose: Secondary | ICD-10-CM | POA: Diagnosis not present

## 2023-02-03 DIAGNOSIS — K219 Gastro-esophageal reflux disease without esophagitis: Secondary | ICD-10-CM | POA: Diagnosis not present

## 2023-02-03 DIAGNOSIS — Z6823 Body mass index (BMI) 23.0-23.9, adult: Secondary | ICD-10-CM | POA: Diagnosis not present

## 2023-02-03 DIAGNOSIS — I10 Essential (primary) hypertension: Secondary | ICD-10-CM | POA: Diagnosis not present

## 2023-02-03 DIAGNOSIS — Z86010 Personal history of colonic polyps: Secondary | ICD-10-CM | POA: Diagnosis not present

## 2023-02-03 DIAGNOSIS — E871 Hypo-osmolality and hyponatremia: Secondary | ICD-10-CM | POA: Diagnosis not present

## 2023-02-03 DIAGNOSIS — E785 Hyperlipidemia, unspecified: Secondary | ICD-10-CM | POA: Diagnosis not present

## 2023-03-03 DIAGNOSIS — Z86010 Personal history of colonic polyps: Secondary | ICD-10-CM | POA: Diagnosis not present

## 2023-07-21 DIAGNOSIS — Z6823 Body mass index (BMI) 23.0-23.9, adult: Secondary | ICD-10-CM | POA: Diagnosis not present

## 2023-07-21 DIAGNOSIS — Z0000 Encounter for general adult medical examination without abnormal findings: Secondary | ICD-10-CM | POA: Diagnosis not present

## 2023-07-21 DIAGNOSIS — Z1331 Encounter for screening for depression: Secondary | ICD-10-CM | POA: Diagnosis not present

## 2023-07-21 DIAGNOSIS — R7303 Prediabetes: Secondary | ICD-10-CM | POA: Diagnosis not present

## 2023-07-21 DIAGNOSIS — E871 Hypo-osmolality and hyponatremia: Secondary | ICD-10-CM | POA: Diagnosis not present

## 2023-08-04 DIAGNOSIS — R7303 Prediabetes: Secondary | ICD-10-CM | POA: Diagnosis not present

## 2023-08-04 DIAGNOSIS — I10 Essential (primary) hypertension: Secondary | ICD-10-CM | POA: Diagnosis not present

## 2023-08-04 DIAGNOSIS — Z6823 Body mass index (BMI) 23.0-23.9, adult: Secondary | ICD-10-CM | POA: Diagnosis not present

## 2023-08-04 DIAGNOSIS — E785 Hyperlipidemia, unspecified: Secondary | ICD-10-CM | POA: Diagnosis not present

## 2023-08-04 DIAGNOSIS — K219 Gastro-esophageal reflux disease without esophagitis: Secondary | ICD-10-CM | POA: Diagnosis not present

## 2024-02-18 DIAGNOSIS — Z79899 Other long term (current) drug therapy: Secondary | ICD-10-CM | POA: Diagnosis not present

## 2024-02-18 DIAGNOSIS — E7800 Pure hypercholesterolemia, unspecified: Secondary | ICD-10-CM | POA: Diagnosis not present

## 2024-02-18 DIAGNOSIS — I10 Essential (primary) hypertension: Secondary | ICD-10-CM | POA: Diagnosis not present

## 2024-02-18 DIAGNOSIS — R7303 Prediabetes: Secondary | ICD-10-CM | POA: Diagnosis not present

## 2024-03-01 DIAGNOSIS — R7303 Prediabetes: Secondary | ICD-10-CM | POA: Diagnosis not present

## 2024-03-01 DIAGNOSIS — I10 Essential (primary) hypertension: Secondary | ICD-10-CM | POA: Diagnosis not present

## 2024-03-01 DIAGNOSIS — Z6823 Body mass index (BMI) 23.0-23.9, adult: Secondary | ICD-10-CM | POA: Diagnosis not present

## 2024-03-01 DIAGNOSIS — E785 Hyperlipidemia, unspecified: Secondary | ICD-10-CM | POA: Diagnosis not present

## 2024-03-01 DIAGNOSIS — Z79899 Other long term (current) drug therapy: Secondary | ICD-10-CM | POA: Diagnosis not present

## 2024-03-01 DIAGNOSIS — E7800 Pure hypercholesterolemia, unspecified: Secondary | ICD-10-CM | POA: Diagnosis not present

## 2024-03-01 DIAGNOSIS — K219 Gastro-esophageal reflux disease without esophagitis: Secondary | ICD-10-CM | POA: Diagnosis not present

## 2024-06-29 DIAGNOSIS — Z6823 Body mass index (BMI) 23.0-23.9, adult: Secondary | ICD-10-CM | POA: Diagnosis not present

## 2024-06-29 DIAGNOSIS — Z23 Encounter for immunization: Secondary | ICD-10-CM | POA: Diagnosis not present

## 2024-06-29 DIAGNOSIS — Z0000 Encounter for general adult medical examination without abnormal findings: Secondary | ICD-10-CM | POA: Diagnosis not present

## 2024-08-27 DIAGNOSIS — I10 Essential (primary) hypertension: Secondary | ICD-10-CM | POA: Diagnosis not present

## 2024-08-27 DIAGNOSIS — R7303 Prediabetes: Secondary | ICD-10-CM | POA: Diagnosis not present

## 2024-09-01 DIAGNOSIS — I10 Essential (primary) hypertension: Secondary | ICD-10-CM | POA: Diagnosis not present

## 2024-09-01 DIAGNOSIS — E785 Hyperlipidemia, unspecified: Secondary | ICD-10-CM | POA: Diagnosis not present

## 2024-09-01 DIAGNOSIS — Z6823 Body mass index (BMI) 23.0-23.9, adult: Secondary | ICD-10-CM | POA: Diagnosis not present

## 2024-09-01 DIAGNOSIS — M545 Low back pain, unspecified: Secondary | ICD-10-CM | POA: Diagnosis not present

## 2024-09-01 DIAGNOSIS — K219 Gastro-esophageal reflux disease without esophagitis: Secondary | ICD-10-CM | POA: Diagnosis not present

## 2024-09-01 DIAGNOSIS — R7303 Prediabetes: Secondary | ICD-10-CM | POA: Diagnosis not present
